# Patient Record
Sex: Female | Born: 1995
Health system: Southern US, Community
[De-identification: ages and names within clinical notes are randomized; demographics above are authoritative.]

---
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
## 2013-08-03 ENCOUNTER — Ambulatory Visit: Payer: Self-pay

## 2013-08-03 ENCOUNTER — Other Ambulatory Visit: Payer: Self-pay | Admitting: Occupational Medicine

## 2013-08-03 DIAGNOSIS — R7611 Nonspecific reaction to tuberculin skin test without active tuberculosis: Secondary | ICD-10-CM

## 2014-08-14 IMAGING — CR DG CHEST 1V
1 series · 1 of 1 positions shown · non-contrast
Comparison: None.

CLINICAL DATA: History of positive PPD

EXAM:
CHEST - 1 VIEW

[view not recorded]
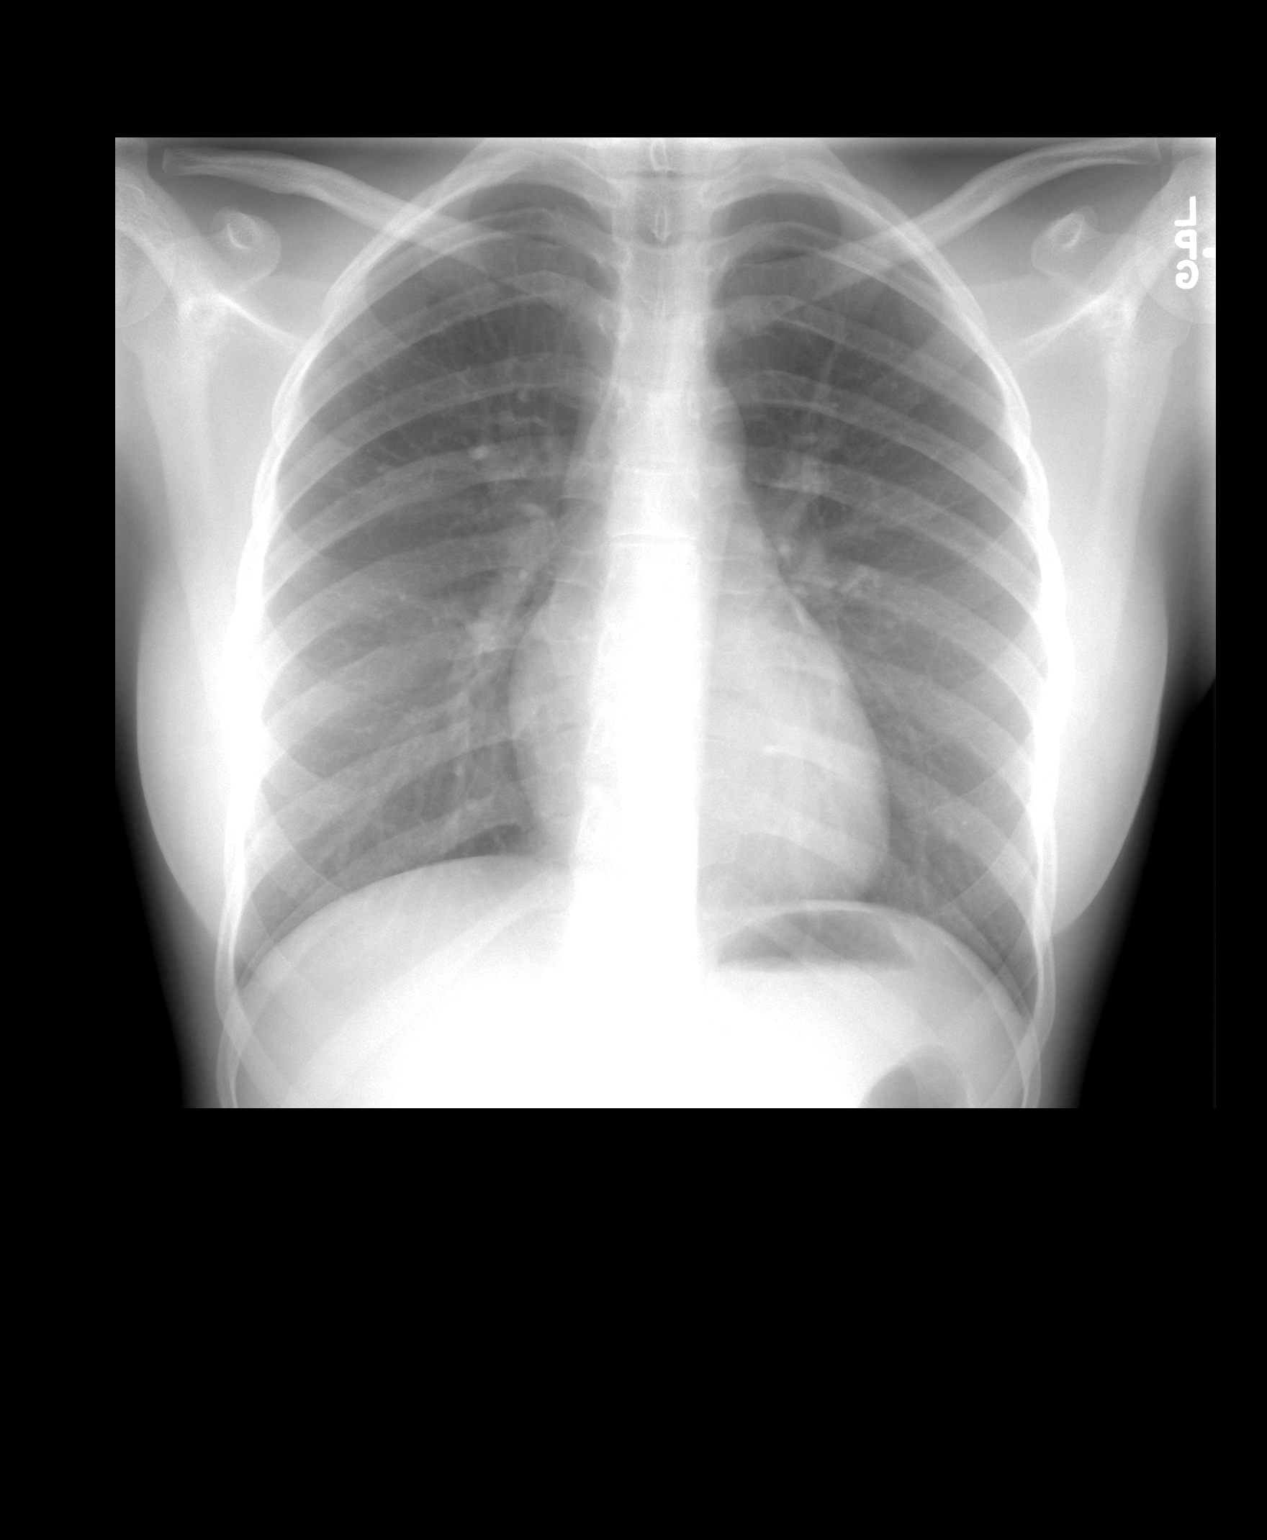

[1 of 1 positions shown; findings below may reference images not displayed]

FINDINGS: The heart size and mediastinal contours are within normal limits.
Both lungs are clear. The visualized skeletal structures are
unremarkable.
IMPRESSION: No active disease.

## 2020-06-01 ENCOUNTER — Other Ambulatory Visit: Payer: Self-pay

## 2020-06-01 ENCOUNTER — Ambulatory Visit
Admission: EM | Admit: 2020-06-01 | Discharge: 2020-06-01 | Disposition: A | Discharge status: Home / Self Care | Payer: BLUE CROSS/BLUE SHIELD | Attending: Physician Assistant | Admitting: Physician Assistant

## 2020-06-01 ENCOUNTER — Encounter: Payer: Self-pay | Admitting: Emergency Medicine

## 2020-06-01 DIAGNOSIS — N3090 Cystitis, unspecified without hematuria: Secondary | ICD-10-CM

## 2020-06-01 LAB — POCT URINALYSIS DIP (MANUAL ENTRY)
Bilirubin, UA: NEGATIVE
Glucose, UA: NEGATIVE mg/dL
Ketones, POC UA: NEGATIVE mg/dL
Nitrite, UA: NEGATIVE
Protein Ur, POC: >=300 mg/dL — AB
Spec Grav, UA: >=1.03 — AB (ref 1.010–1.025)
Urobilinogen, UA: 1 E.U./dL
pH, UA: 6.5 (ref 5.0–8.0)

## 2020-06-01 MED ORDER — FLUCONAZOLE 150 MG PO TABS: 150.0000 mg | ORAL_TABLET | Freq: Every day | ORAL | 0 refills | Status: AC

## 2020-06-01 MED ORDER — CEPHALEXIN 500 MG PO CAPS: 500.0000 mg | ORAL_CAPSULE | Freq: Two times a day (BID) | ORAL | 0 refills | Status: AC

## 2020-06-01 NOTE — Discharge Instructions (Signed)
Your urine was positive for an urinary tract infection. Start keflex as directed. Keep hydrated, urine should be clear to pale yellow in color. Monitor for any worsening of symptoms, fever, worsening abdominal pain, nausea/vomiting, flank pain, follow up for reevaluation.  ° °

## 2020-06-01 NOTE — ED Provider Notes (Signed)
[facility]    CSN: [id number] Arrival date & time: 06/01/20  0855      History   Chief Complaint Chief Complaint  Patient presents with  . Dysuria    HPI Rachel Shannon is a 24 y.o. female.   24 year old female comes in for 3 day history of urinary symptoms. Has had dysuria, urinary frequency. Suprapubic cramping when urinating. Denies nausea, vomiting. Denies fever, chills, flank/back pain. Slight vaginal itching/discomfort. Denies vaginal discharge, spotting. LMP 05/12/2020. No changes in hygeine product      History reviewed. No pertinent past medical history.  There are no problems to display for this patient.   History reviewed. No pertinent surgical history.  OB History   No obstetric history on file.      Home Medications    Prior to Admission medications   Medication Sig Start Date End Date Taking? Authorizing Provider  norethindrone-ethinyl estradiol (LOESTRIN FE) 1-20 MG-MCG tablet Take 1 tablet by mouth daily.   Yes [provider]  cephALEXin (KEFLEX) 500 MG capsule Take 1 capsule (500 mg total) by mouth 2 (two) times daily. 06/01/20   Cathie Hoops, Freddi Forster V, PA-C  fluconazole (DIFLUCAN) 150 MG tablet Take 1 tablet (150 mg total) by mouth daily. Take second dose 72 hours later if symptoms still persists. 06/01/20   Belinda Fisher, PA-C    Family History Family History  Problem Relation Age of Onset  . Healthy Mother   . Healthy Father     Social History Social History   Tobacco Use  . Smoking status: Never Smoker  . Smokeless tobacco: Never Used  Substance Use Topics  . Alcohol use: No  . Drug use: No     Allergies   Patient has no known allergies.   Review of Systems Review of Systems  Reason unable to perform ROS: See HPI as above.     Physical Exam Triage Vital Signs ED Triage Vitals  Enc Vitals Group     BP 06/01/20 0915 126/84     Pulse Rate 06/01/20 0915 73     Resp 06/01/20 0915 18     Temp 06/01/20 0915 98.3  F (36.8 C)     Temp Source 06/01/20 0915 Oral     SpO2 06/01/20 0915 99 %     Weight --      Height --      Head Circumference --      Peak Flow --      Pain Score 06/01/20 0916 5     Pain Loc --      Pain Edu? --      Excl. in GC? --    No data found.  Updated Vital Signs BP 126/84 (BP Location: Left Arm)   Pulse 73   Temp 98.3 F (36.8 C) (Oral)   Resp 18   SpO2 99%   Physical Exam Constitutional:      General: She is not in acute distress.    Appearance: She is well-developed. She is not ill-appearing, toxic-appearing or diaphoretic.  HENT:     Head: Normocephalic and atraumatic.  Eyes:     Conjunctiva/sclera: Conjunctivae normal.     Pupils: Pupils are equal, round, and reactive to light.  Cardiovascular:     Rate and Rhythm: Normal rate and regular rhythm.  Pulmonary:     Effort: Pulmonary effort is normal. No respiratory distress.     Comments: LCTAB Abdominal:     General: Bowel sounds are  normal.     Palpations: Abdomen is soft.     Tenderness: There is no abdominal tenderness. There is no right CVA tenderness, left CVA tenderness, guarding or rebound.  Musculoskeletal:     Cervical back: Normal range of motion and neck supple.  Skin:    General: Skin is warm and dry.  Neurological:     Mental Status: She is alert and oriented to person, place, and time.  Psychiatric:        Behavior: Behavior normal.        Judgment: Judgment normal.      UC Treatments / Results  Labs (all labs ordered are listed, but only abnormal results are displayed) Labs Reviewed  POCT URINALYSIS DIP (MANUAL ENTRY) - Abnormal; Notable for the following components:      Result Value   Clarity, UA cloudy (*)    Spec Grav, UA >=1.030 (*)    Blood, UA large (*)    Protein Ur, POC >=300 (*)    Leukocytes, UA Moderate (2+) (*)    All other components within normal limits  URINE CULTURE    EKG   Radiology No results found.  Procedures Procedures (including critical  care time)  Medications Ordered in UC Medications - No data to display  Initial Impression / Assessment and Plan / UC Course  I have reviewed the triage vital signs and the nursing notes.  Pertinent labs & imaging results that were available during my care of the patient were reviewed by me and considered in my medical decision making (see chart for details).    Urine dipstick positive for UTI. Start antibiotics as directed. Push fluids. Return precautions given.  Final Clinical Impressions(s) / UC Diagnoses   Final diagnoses:  Cystitis    ED Prescriptions    Medication Sig Dispense Auth. Provider   cephALEXin (KEFLEX) 500 MG capsule Take 1 capsule (500 mg total) by mouth 2 (two) times daily. 10 capsule Adaja Wander V, PA-C   fluconazole (DIFLUCAN) 150 MG tablet Take 1 tablet (150 mg total) by mouth daily. Take second dose 72 hours later if symptoms still persists. 2 tablet Belinda Fisher, PA-C     PDMP not reviewed this encounter.   Belinda Fisher, PA-C 06/01/20 [phone]

## 2020-06-01 NOTE — ED Triage Notes (Signed)
Pt here for dysuria x 3 days with frequency and some pain with urination

## 2020-06-04 ENCOUNTER — Telehealth (HOSPITAL_COMMUNITY): Payer: Self-pay | Admitting: *Deleted

## 2020-06-04 LAB — URINE CULTURE: Culture: >=100000 — AB

## 2020-06-04 MED ORDER — SULFAMETHOXAZOLE-TRIMETHOPRIM 800-160 MG PO TABS: 1.0000 | ORAL_TABLET | Freq: Two times a day (BID) | ORAL | 0 refills | Status: AC

## 2020-06-04 NOTE — Telephone Encounter (Signed)
Called to discuss UA culture. Patient is still having symptoms, will change rx to Bactrim to pharmacy of record. Questions answered. Will stop keflex previously prescribed.

## 2020-10-13 DIAGNOSIS — Z3041 Encounter for surveillance of contraceptive pills: Secondary | ICD-10-CM | POA: Diagnosis not present

## 2021-01-05 DIAGNOSIS — Z01818 Encounter for other preprocedural examination: Secondary | ICD-10-CM | POA: Diagnosis not present

## 2021-01-05 DIAGNOSIS — Z0000 Encounter for general adult medical examination without abnormal findings: Secondary | ICD-10-CM | POA: Diagnosis not present

## 2021-01-05 DIAGNOSIS — Z13220 Encounter for screening for lipoid disorders: Secondary | ICD-10-CM | POA: Diagnosis not present

## 2021-01-05 DIAGNOSIS — J339 Nasal polyp, unspecified: Secondary | ICD-10-CM | POA: Diagnosis not present

## 2021-10-05 DIAGNOSIS — Z113 Encounter for screening for infections with a predominantly sexual mode of transmission: Secondary | ICD-10-CM | POA: Diagnosis not present

## 2021-10-05 DIAGNOSIS — Z3202 Encounter for pregnancy test, result negative: Secondary | ICD-10-CM | POA: Diagnosis not present

## 2021-10-05 DIAGNOSIS — N390 Urinary tract infection, site not specified: Secondary | ICD-10-CM | POA: Diagnosis not present

## 2021-10-05 DIAGNOSIS — R300 Dysuria: Secondary | ICD-10-CM | POA: Diagnosis not present

## 2022-01-04 DIAGNOSIS — Z3041 Encounter for surveillance of contraceptive pills: Secondary | ICD-10-CM | POA: Diagnosis not present

## 2022-01-04 DIAGNOSIS — Z3202 Encounter for pregnancy test, result negative: Secondary | ICD-10-CM | POA: Diagnosis not present

## 2023-07-04 DIAGNOSIS — Z3201 Encounter for pregnancy test, result positive: Secondary | ICD-10-CM | POA: Diagnosis not present

## 2023-08-19 DIAGNOSIS — Z124 Encounter for screening for malignant neoplasm of cervix: Secondary | ICD-10-CM | POA: Diagnosis not present

## 2023-08-19 DIAGNOSIS — Z01419 Encounter for gynecological examination (general) (routine) without abnormal findings: Secondary | ICD-10-CM | POA: Diagnosis not present

## 2023-08-19 DIAGNOSIS — N898 Other specified noninflammatory disorders of vagina: Secondary | ICD-10-CM | POA: Diagnosis not present

## 2023-08-19 DIAGNOSIS — Z118 Encounter for screening for other infectious and parasitic diseases: Secondary | ICD-10-CM | POA: Diagnosis not present

## 2023-09-16 DIAGNOSIS — Z3202 Encounter for pregnancy test, result negative: Secondary | ICD-10-CM | POA: Diagnosis not present

## 2023-09-16 DIAGNOSIS — Z3046 Encounter for surveillance of implantable subdermal contraceptive: Secondary | ICD-10-CM | POA: Diagnosis not present

## 2024-04-20 DIAGNOSIS — Z0000 Encounter for general adult medical examination without abnormal findings: Secondary | ICD-10-CM | POA: Diagnosis not present

## 2024-04-20 DIAGNOSIS — Z23 Encounter for immunization: Secondary | ICD-10-CM | POA: Diagnosis not present

## 2024-04-20 DIAGNOSIS — Z13220 Encounter for screening for lipoid disorders: Secondary | ICD-10-CM | POA: Diagnosis not present

## 2024-04-20 DIAGNOSIS — Z1389 Encounter for screening for other disorder: Secondary | ICD-10-CM | POA: Diagnosis not present
